# Patient Record
Sex: Male | Born: 1980 | Race: White | Hispanic: No | Marital: Single | Smoking: Current every day smoker
Health system: Southern US, Community
[De-identification: ages and names within clinical notes are randomized; demographics above are authoritative.]

## PROBLEM LIST (undated history)

## (undated) DIAGNOSIS — F329 Major depressive disorder, single episode, unspecified: Secondary | ICD-10-CM

## (undated) DIAGNOSIS — F32A Depression, unspecified: Secondary | ICD-10-CM

---

## 2017-04-26 ENCOUNTER — Emergency Department (HOSPITAL_COMMUNITY)
Admission: EM | Admit: 2017-04-26 | Discharge: 2017-04-27 | Disposition: A | Discharge status: Home / Self Care | Payer: Self-pay | Attending: Emergency Medicine | Admitting: Emergency Medicine

## 2017-04-26 ENCOUNTER — Encounter (HOSPITAL_COMMUNITY): Payer: Self-pay | Admitting: Emergency Medicine

## 2017-04-26 DIAGNOSIS — R229 Localized swelling, mass and lump, unspecified: Secondary | ICD-10-CM

## 2017-04-26 DIAGNOSIS — F1110 Opioid abuse, uncomplicated: Secondary | ICD-10-CM | POA: Insufficient documentation

## 2017-04-26 DIAGNOSIS — F1910 Other psychoactive substance abuse, uncomplicated: Secondary | ICD-10-CM

## 2017-04-26 DIAGNOSIS — F17210 Nicotine dependence, cigarettes, uncomplicated: Secondary | ICD-10-CM | POA: Insufficient documentation

## 2017-04-26 HISTORY — DX: Major depressive disorder, single episode, unspecified: F32.9

## 2017-04-26 HISTORY — DX: Depression, unspecified: F32.A

## 2017-04-26 LAB — BASIC METABOLIC PANEL
Anion gap: 9 (ref 5–15)
BUN: 12 mg/dL (ref 6–20)
CHLORIDE: 108 mmol/L (ref 101–111)
CO2: 23 mmol/L (ref 22–32)
Calcium: 9 mg/dL (ref 8.9–10.3)
Creatinine, Ser: 1.09 mg/dL (ref 0.61–1.24)
GFR calc Af Amer: >60 mL/min (ref >60)
GFR calc non Af Amer: >60 mL/min (ref >60)
Glucose, Bld: 119 mg/dL — ABNORMAL HIGH (ref 65–99)
Potassium: 4.2 mmol/L (ref 3.5–5.1)
SODIUM: 140 mmol/L (ref 135–145)

## 2017-04-26 LAB — CBC
HCT: 47.3 % (ref 39.0–52.0)
HEMOGLOBIN: 16.1 g/dL (ref 13.0–17.0)
MCH: 30.6 pg (ref 26.0–34.0)
MCHC: 34 g/dL (ref 30.0–36.0)
MCV: 89.9 fL (ref 78.0–100.0)
PLATELETS: 362 10*3/uL (ref 150–400)
RBC: 5.26 MIL/uL (ref 4.22–5.81)
RDW: 14.3 % (ref 11.5–15.5)
WBC: 12.1 10*3/uL — ABNORMAL HIGH (ref 4.0–10.5)

## 2017-04-26 LAB — I-STAT CG4 LACTIC ACID, ED: LACTIC ACID, VENOUS: 2.7 mmol/L — AB (ref 0.5–1.9)

## 2017-04-26 MED ORDER — VANCOMYCIN HCL 10 G IV SOLR
1500.0000 mg | Freq: Once | INTRAVENOUS | Status: AC
  Administered 2017-04-27: 1500 mg via INTRAVENOUS
  Filled 2017-04-26: qty 1500

## 2017-04-26 MED ORDER — SODIUM CHLORIDE 0.9 % IV BOLUS (SEPSIS)
2000.0000 mL | Freq: Once | INTRAVENOUS | Status: AC
  Administered 2017-04-27: 2000 mL via INTRAVENOUS

## 2017-04-26 NOTE — ED Notes (Signed)
Pt reports last heroin usage was Tues 5/22. Pt states he has been very lethargic and sleepy lately. Pt's family states he will be awake one minute and be asleep the next. Pt has bumps the size of a nickel up and down forearms from where he would use.

## 2017-04-26 NOTE — ED Provider Notes (Signed)
[facility]-EMERGENCY DEPT Provider Note   CSN: [id number] Arrival date & time: 04/26/17  2011   History   Chief Complaint Chief Complaint  Patient presents with  . multiple sore areas to arms from heroine use per pt    HPI Marcus Boone is a 36 y.o. male who presents with multiple nodules on bilateral forearms. Past medical history significant for IV drug abuse (heroin). He states the last time he used was 6 days ago. His wife noticed several skin nodules on his forearms a couple days ago and believes they are getting bigger. He has 4 nodules on his right forearm and one on his left. He denies that they are causing a lot of pain and denies drainage from the area. He lives in [place], [place] and came to [place] to be with his wife who lives here and detox. He is working on getting into a detox center in [place] but needs to get the financial aspect of that arranged. He has been going through withdrawals and states that the main symptom he is having now is "crippling anxiety". He denies any known fevers, chest pain, shortness of breath. Wife reports that he has had significant fatigue.  HPI  Past Medical History:  Diagnosis Date  . Depression     There are no active problems to display for this patient.   History reviewed. No pertinent surgical history.     Home Medications    Prior to Admission medications   Not on File    Family History No family history on file.  Social History Social History  Substance Use Topics  . Smoking status: Current Every Day Smoker    Packs/day: 1.00    Types: Cigarettes  . Smokeless tobacco: Never Used  . Alcohol use Yes     Comment: occasionally     Allergies   Tramadol   Review of Systems Review of Systems  Constitutional: Positive for diaphoresis and fatigue. Negative for chills and fever.  Respiratory: Negative for shortness of breath.   Cardiovascular: Negative for chest pain.  Gastrointestinal: Negative for abdominal  pain, nausea and vomiting.  Musculoskeletal: Negative for back pain.  Skin:       +nodules  Psychiatric/Behavioral: The patient is nervous/anxious.   All other systems reviewed and are negative.    Physical Exam Updated Vital Signs BP 111/75   Pulse 96   Temp 98.7 F (37.1 C) (Oral)   Resp 18   Ht 5\' 11"  (1.803 m)   Wt 78 kg (172 lb)   SpO2 100%   BMI 23.99 kg/m   Physical Exam  Constitutional: He is oriented to person, place, and time. He appears well-developed and well-nourished. No distress.  HENT:  Head: Normocephalic and atraumatic.  Eyes: Conjunctivae are normal. Pupils are equal, round, and reactive to light. Right eye exhibits no discharge. Left eye exhibits no discharge. No scleral icterus.  Neck: Normal range of motion.  Cardiovascular: Normal rate and regular rhythm.  Exam reveals no gallop and no friction rub.   No murmur heard. Pulmonary/Chest: Effort normal and breath sounds normal. No respiratory distress. He has no wheezes. He has no rales. He exhibits no tenderness.  Abdominal: He exhibits no distension.  Neurological: He is alert and oriented to person, place, and time.  Skin: Skin is warm and dry.  4 quarter sized nodules on right forearm which are mildly tender to palpation. No redness or warmth. 1 quarter sized nodule on left forearm. No fluctuance or drainage  Psychiatric:  His behavior is normal. His mood appears anxious.  Nursing note and vitals reviewed.    ED Treatments / Results  Labs (all labs ordered are listed, but only abnormal results are displayed) Labs Reviewed  CBC - Abnormal; Notable for the following:       Result Value   WBC 12.1 (*)    All other components within normal limits  BASIC METABOLIC PANEL - Abnormal; Notable for the following:    Glucose, Bld 119 (*)    All other components within normal limits  I-STAT CG4 LACTIC ACID, ED - Abnormal; Notable for the following:    Lactic Acid, Venous 2.70 (*)    All other components  within normal limits  CULTURE, BLOOD (ROUTINE X 2)  CULTURE, BLOOD (ROUTINE X 2)  HIV ANTIBODY (ROUTINE TESTING)  HEPATITIS C ANTIBODY  I-STAT CG4 LACTIC ACID, ED    EKG  EKG Interpretation None       Radiology No results found.  Procedures Procedures (including critical care time)  Medications Ordered in ED Medications  sodium chloride 0.9 % bolus 2,000 mL (not administered)  vancomycin (VANCOCIN) 1,500 mg in sodium chloride 0.9 % 500 mL IVPB (not administered)     Initial Impression / Assessment and Plan / ED Course  I have reviewed the triage vital signs and the nursing notes.  Pertinent labs & imaging results that were available during my care of the patient were reviewed by me and considered in my medical decision making (see chart for details).  36 year old male with multiple skin nodules. US was used to evaluate nodules. They do not appear consistent with abscesses. Vital signs are normal. CBC remarkable for leukocytosis of 12.1. BMP unremarkable. Lactic acid is 2.7. Blood cultures were drawn. HIV and Hep C were obtained per pt and family request. Shared visit with Dr. Ranae PalmsYelverton. Will start fluids and antibiotics.   Patient signed out to K Humes PA-C at shift change. If repeat Lactic acid is improved, anticipate d/c with resources.  Final Clinical Impressions(s) / ED Diagnoses   Final diagnoses:  IV drug abuse    New Prescriptions New Prescriptions   No medications on file     Beryle QuantGekas, Joseth Weigel Marie, PA-C 04/27/17 0050    Loren RacerYelverton, David, MD 05/06/17 1324

## 2017-04-26 NOTE — ED Triage Notes (Signed)
Reports coming here last week for detox from heroine.  Now c/o wounds to bil arms from using.

## 2017-04-27 LAB — I-STAT CG4 LACTIC ACID, ED
LACTIC ACID, VENOUS: 0.86 mmol/L (ref 0.5–1.9)
Lactic Acid, Venous: 2.06 mmol/L (ref 0.5–1.9)

## 2017-04-27 LAB — HIV ANTIBODY (ROUTINE TESTING W REFLEX): HIV Screen 4th Generation wRfx: NONREACTIVE

## 2017-04-27 MED ORDER — CLONIDINE HCL 0.1 MG PO TABS: 0.1000 mg | ORAL_TABLET | Freq: Two times a day (BID) | ORAL | 1 refills | Status: AC

## 2017-04-27 MED ORDER — GI COCKTAIL ~~LOC~~
30.0000 mL | Freq: Once | ORAL | Status: AC
  Administered 2017-04-27: 30 mL via ORAL
  Filled 2017-04-27: qty 30

## 2017-04-27 MED ORDER — CEPHALEXIN 500 MG PO CAPS
500.0000 mg | ORAL_CAPSULE | Freq: Four times a day (QID) | ORAL | 0 refills | Status: AC
Start: 2017-04-27 — End: ?

## 2017-04-27 MED ORDER — ONDANSETRON 4 MG PO TBDP: 4.0000 mg | ORAL_TABLET | Freq: Three times a day (TID) | ORAL | 0 refills | Status: AC | PRN

## 2017-04-27 MED ORDER — RANITIDINE HCL 150 MG/10ML PO SYRP
150.0000 mg | ORAL_SOLUTION | Freq: Once | ORAL | Status: AC
  Administered 2017-04-27: 150 mg via ORAL
  Filled 2017-04-27: qty 10

## 2017-04-27 NOTE — ED Notes (Signed)
Second I-stat lactic acid was drawn before any fluids or medications was given.

## 2017-04-27 NOTE — Discharge Instructions (Signed)
Take Keflex to prevent infection. You may use Zofran for nausea and Catapres for anxiety or palpitations associated with withdrawal. Take Pepcid OTC for reflux symptoms. Continue with outpatient drug rehabilitation.

## 2017-04-27 NOTE — ED Notes (Signed)
Attempted IV x 2 with no success. 

## 2017-04-27 NOTE — ED Provider Notes (Signed)
5:20 AM Patient care assumed from [facility] D[facility], PA-C at change of shift. Patient with history of heroin abuse. He is presenting for sores to his forearm which are more consistent with nodules. No signs of acute infection, the patient has been covered with antibiotics. He was noted to have a leukocytosis and elevated lactic acid level. I suspect this to be due to acute withdrawal. Lactic acid level has normalized following IV fluids.  Patient reassessed. He is complaining of reflux like symptoms. Zantac ordered to be given. Vitals stable and patient is nontoxic appearing. I do not believe further emergent workup or inpatient management is indicated. The patient has been instructed to continue with outpatient rehabilitation as planned. Supportive medications prescribed and return precautions given. Patient discharged in stable condition with no unaddressed concerns.   Marcus Boone, Marcus Chervenak, PA-C 04/27/17 [id number]    Dione BoozeGlick, David, MD 04/27/17 [phone]

## 2017-04-28 LAB — HEPATITIS C ANTIBODY: HCV Ab: >11 s/co ratio — ABNORMAL HIGH (ref 0.0–0.9)

## 2017-05-02 LAB — CULTURE, BLOOD (ROUTINE X 2)
CULTURE: NO GROWTH
Culture: NO GROWTH
SPECIAL REQUESTS: ADEQUATE
SPECIAL REQUESTS: ADEQUATE

## 2019-12-09 ENCOUNTER — Emergency Department (HOSPITAL_COMMUNITY)
Admission: EM | Admit: 2019-12-09 | Discharge: 2019-12-09 | Disposition: A | Discharge status: Home / Self Care | Payer: Self-pay | Attending: Emergency Medicine | Admitting: Emergency Medicine

## 2019-12-09 ENCOUNTER — Other Ambulatory Visit: Payer: Self-pay

## 2019-12-09 ENCOUNTER — Encounter (HOSPITAL_COMMUNITY): Payer: Self-pay | Admitting: Emergency Medicine

## 2019-12-09 ENCOUNTER — Emergency Department (HOSPITAL_COMMUNITY): Payer: Self-pay

## 2019-12-09 DIAGNOSIS — F17210 Nicotine dependence, cigarettes, uncomplicated: Secondary | ICD-10-CM | POA: Insufficient documentation

## 2019-12-09 DIAGNOSIS — S62001A Unspecified fracture of navicular [scaphoid] bone of right wrist, initial encounter for closed fracture: Secondary | ICD-10-CM | POA: Insufficient documentation

## 2019-12-09 DIAGNOSIS — Y939 Activity, unspecified: Secondary | ICD-10-CM | POA: Insufficient documentation

## 2019-12-09 DIAGNOSIS — Y999 Unspecified external cause status: Secondary | ICD-10-CM | POA: Insufficient documentation

## 2019-12-09 DIAGNOSIS — S62346A Nondisplaced fracture of base of fifth metacarpal bone, right hand, initial encounter for closed fracture: Secondary | ICD-10-CM | POA: Insufficient documentation

## 2019-12-09 DIAGNOSIS — S62001K Unspecified fracture of navicular [scaphoid] bone of right wrist, subsequent encounter for fracture with nonunion: Secondary | ICD-10-CM

## 2019-12-09 DIAGNOSIS — Y929 Unspecified place or not applicable: Secondary | ICD-10-CM | POA: Insufficient documentation

## 2019-12-09 DIAGNOSIS — Y33XXXA Other specified events, undetermined intent, initial encounter: Secondary | ICD-10-CM | POA: Insufficient documentation

## 2019-12-09 DIAGNOSIS — Z79899 Other long term (current) drug therapy: Secondary | ICD-10-CM | POA: Insufficient documentation

## 2019-12-09 MED ORDER — IBUPROFEN 800 MG PO TABS: 800.0000 mg | ORAL_TABLET | Freq: Three times a day (TID) | ORAL | 0 refills | Status: AC

## 2019-12-09 NOTE — ED Triage Notes (Signed)
Pt reports R wrist/hand pain x 2 months. States he has been wearing a velcro wrist splint.  This morning he pushed down on ground to stand up and c/o pain to R hand and wrist.

## 2019-12-09 NOTE — ED Notes (Signed)
[facility]ro Pharmacist, hospital.

## 2019-12-09 NOTE — Discharge Instructions (Addendum)
Please call Dr. 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  Please take the ibuprofen 800 mg 3 times daily as needed for your pain and discomfort. Please discontinue if you notice any dark black stools.  Return to the ED or seek medical attention for any new or worsening symptoms.

## 2019-12-09 NOTE — ED Notes (Signed)
Ortho paged for splint 

## 2019-12-09 NOTE — ED Notes (Signed)
Pt dc'd home w/all belongings, a/o x4, ambulatory on dc  

## 2019-12-09 NOTE — Progress Notes (Signed)
Orthopedic Tech Progress Note Patient Details:  Marcus Boone 03-Mar-1981 [id number]  Ortho Devices Type of Ortho Device: Ace wrap, Ulna gutter splint Ortho Device/Splint Interventions: Application   Post Interventions Patient Tolerated: Well Instructions Provided: Care of device   Saul Fordyce 12/09/2019, 4:25 PM

## 2019-12-09 NOTE — ED Provider Notes (Signed)
[facility] EMERGENCY DEPARTMENT Provider Note   CSN: [id number] Arrival date & time: 12/09/19  1342     History Chief Complaint  Patient presents with  . Hand Pain    Marcus Boone is a 39 y.o. male with no relevant past medical history presents to the ED after sustaining hand injury.  Patient apparently injured his wrist 6 months ago during a skateboarding accident and has since been wearing a splint that he had obtained at [facility].  He has been making a concerted effort not to use his right hand, but when he went to assist himself to his feet after sitting on the ground drinking coffee, he lost his balance and caught himself using his right hand that had been balled up in a fist.  Patient immediately felt pain on the right side of his hand and noticed swelling which prompted him to come to the ED for evaluation.  He adamantly denies any fighting and instead states that he has never thrown a punch in his life.  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  He denies any other injury, numbness or tingling, or other symptoms.  He took 600 mg ibuprofen with good effect.   HPI     Past Medical History:  Diagnosis Date  . Depression     There are no problems to display for this patient.   History reviewed. No pertinent surgical history.     No family history on file.  Social History   Tobacco Use  . Smoking status: Current Every Day Smoker    Packs/day: 1.00    Types: Cigarettes  . Smokeless tobacco: Never Used  Substance Use Topics  . Alcohol use: Yes    Comment: occasionally  . Drug use: Yes    Types: IV    Home Medications Prior to Admission medications   Medication Sig Start Date End Date Taking? Authorizing Provider  cephALEXin (KEFLEX) 500 MG capsule Take 1 capsule (500 mg total) by mouth 4 (four) times daily. 04/27/17   Antonietta Breach, PA-C  cloNIDine (CATAPRES) 0.1 MG tablet Take 1 tablet (0.1 mg total) by mouth 2 (two) times  daily. For palpitations or anxiety associated with withdrawal 04/27/17   Antonietta Breach, PA-C  ibuprofen (ADVIL) 800 MG tablet Take 1 tablet (800 mg total) by mouth 3 (three) times daily. 12/09/19   Corena Herter, PA-C  Multiple Vitamin (MULTIVITAMIN WITH MINERALS) TABS tablet Take 1 tablet by mouth once a week.    [provider]  ondansetron (ZOFRAN ODT) 4 MG disintegrating tablet Take 1 tablet (4 mg total) by mouth every 8 (eight) hours as needed for nausea or vomiting. 04/27/17   Antonietta Breach, PA-C    Allergies    Tramadol  Review of Systems   Review of Systems  Constitutional: Negative for fever.  Musculoskeletal: Positive for arthralgias.  Skin: Positive for color change.    Physical Exam Updated Vital Signs BP (!) 116/100   Pulse 78   Temp 98.8 F (37.1 C) (Oral)   Resp 18   SpO2 100%   Physical Exam Vitals and nursing note reviewed. Exam conducted with a chaperone present.  Constitutional:      Appearance: Normal appearance.  HENT:     Head: Normocephalic and atraumatic.  Eyes:     General: No scleral icterus.    Conjunctiva/sclera: Conjunctivae normal.  Cardiovascular:     Rate and Rhythm: Normal rate and regular rhythm.  Pulmonary:  Effort: Pulmonary effort is normal.     Breath sounds: Normal breath sounds.  Musculoskeletal:     Comments: Right hand: Swelling and mild ecchymosis noted over the 5th metacarpal.  Cap refill intact.  Assessed radial, ulnar, and median nerves and sensation intact throughout.  Patient can flex and extend against resistance.  Wrist flexion and extension limited.    Skin:    General: Skin is dry.  Neurological:     Mental Status: He is alert and oriented to person, place, and time.     GCS: GCS eye subscore is 4. GCS verbal subscore is 5. GCS motor subscore is 6.  Psychiatric:        Mood and Affect: Mood normal.        Behavior: Behavior normal.        Thought Content: Thought content normal.     ED Results /  Procedures / Treatments   Labs (all labs ordered are listed, but only abnormal results are displayed) Labs Reviewed - No data to display  EKG None  Radiology DG Wrist Complete Right  Result Date: 12/09/2019 CLINICAL DATA:  Wrist and hand pain for 2 months EXAM: RIGHT WRIST - COMPLETE 3+ VIEW COMPARISON:  None. FINDINGS: Lucency through the scaphoid waist with irregular margins and sclerosis compatible with a nonunion chronic scaphoid fracture. Mild increased sclerosis of the proximal scaphoid pole may represent early AVN changes. Widening of the scapholunate interval can be associated with ligamentous injury. Distal radius and ulna intact. Mild soft tissue swelling. a Additionally, there is an acute nondisplaced fracture of the right fifth metacarpal base. IMPRESSION: Acute nondisplaced fracture right fifth metacarpal base. 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 See above comment. Electronically Signed   By: Judie Petit.  Shick M.D.   On: 12/09/2019 14:36   DG Hand Complete Right  Result Date: 12/09/2019 CLINICAL DATA:  Fall, pain EXAM: RIGHT HAND - COMPLETE 3+ VIEW COMPARISON:  12/09/2019 FINDINGS: There is a subtle acute nondisplaced fracture of the right fifth metacarpal base close to the wrist. Mild soft tissue swelling. Metacarpals appear intact. No other acute osseous finding of the hand. Chronic nonunion scaphoid waist fracture. IMPRESSION: Acute nondisplaced fracture right fifth metacarpal base Chronic nonunion scaphoid waist fracture. Electronically Signed   By: Judie Petit.  Shick M.D.   On: 12/09/2019 14:38    Procedures Procedures (including critical care time)  Medications Ordered in ED Medications - No data to display  ED Course  I have reviewed the triage vital signs and the nursing notes.  Pertinent labs & imaging results that were available during my care of the patient were reviewed by me and considered in my medical decision making (see  chart for details).    MDM Rules/Calculators/A&P                      DG right wrist demonstrate acute nondisplaced fracture of the base of the fifth metacarpal in addition to a chronic nonunion scaphoid fracture.  Will refer patient to Dr. Melvyn Novas, hand specialist, for ongoing evaluation and management.  Given patient's nonunion scaphoid fracture, he may benefit from osteogenesis stimulator or surgical correction.  Patient's vital signs are within normal limits and he is neurovascularly intact.  We will place patient in an ulnar gutter splint and prescribed ibuprofen 800 mg 3 times daily as needed for his pain discomfort.  Encouraged patient to ice the affected area today and to call the office of  Dr. Melvyn Novas soon as possible to schedule an appointment for further evaluation.  Also will provide patient a referral to [facility] health committee health and wellness so that he may get established with a primary care provider.  Patient is from [facility] and has not been evaluated by a primary care provider since his move to [place].  Return precautions discussed. All of the evaluation and work-up results were discussed with the patient and any family at bedside. They were provided opportunity to ask any additional questions and have none at this time. They have expressed understanding of verbal discharge instructions as well as return precautions and are agreeable to the plan.    Final Clinical Impression(s) / ED Diagnoses Final diagnoses:  Closed nondisplaced fracture of base of fifth metacarpal bone of right hand, initial encounter  Closed nondisplaced fracture of scaphoid of right wrist with nonunion, unspecified portion of scaphoid, subsequent encounter    Rx / DC Orders ED Discharge Orders         Ordered    ibuprofen (ADVIL) 800 MG tablet  3 times daily     12/09/19 1606           Lorelee New, PA-C 12/09/19 1606    Pricilla Loveless, MD 12/10/19 [phone]

## 2021-07-16 IMAGING — DX DG HAND COMPLETE 3+V*R*
3 series · 3 of 3 positions shown · non-contrast
Comparison: 12/09/2019

CLINICAL DATA: Fall, pain

EXAM:
RIGHT HAND - COMPLETE 3+ VIEW

[hand pa]
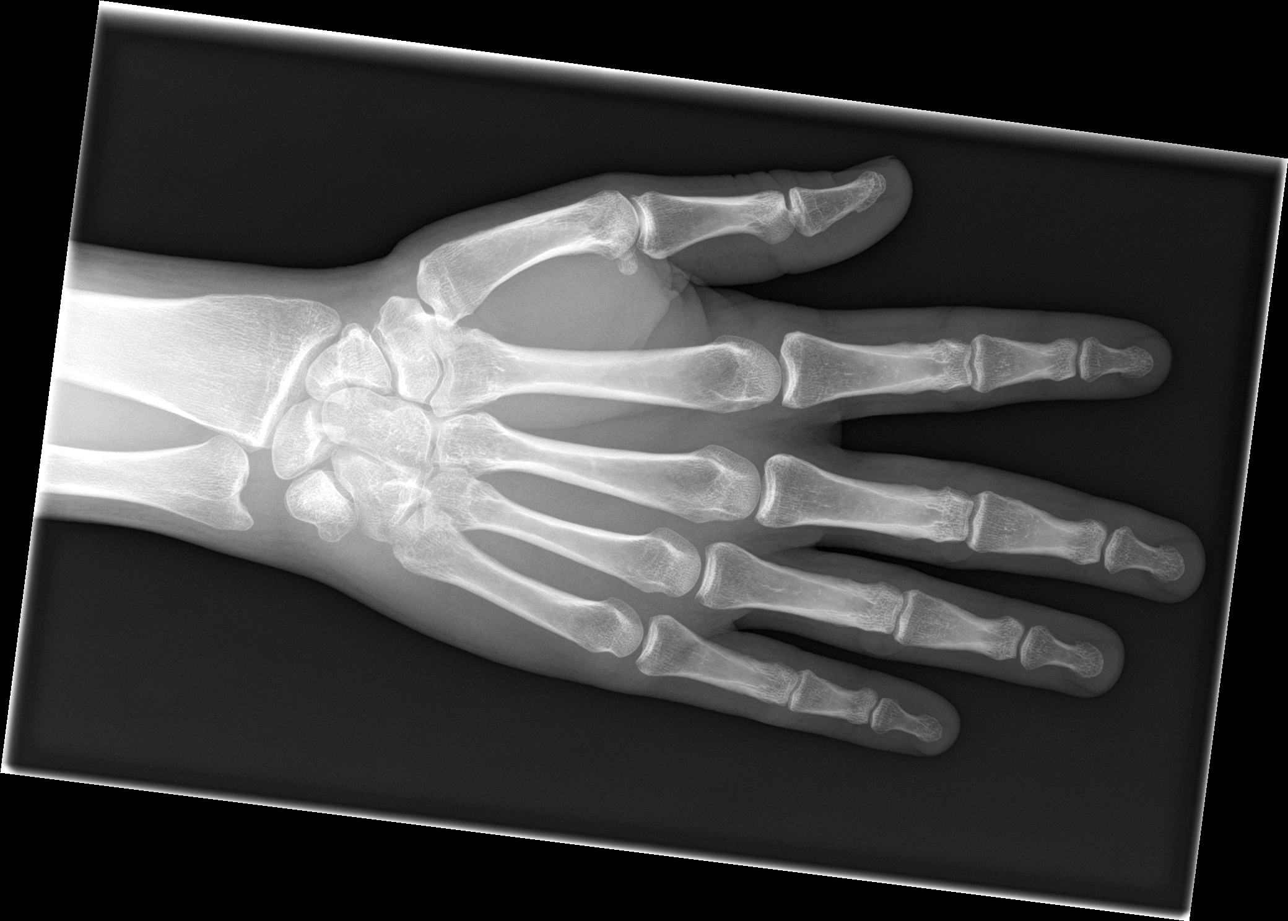

[hand obl]
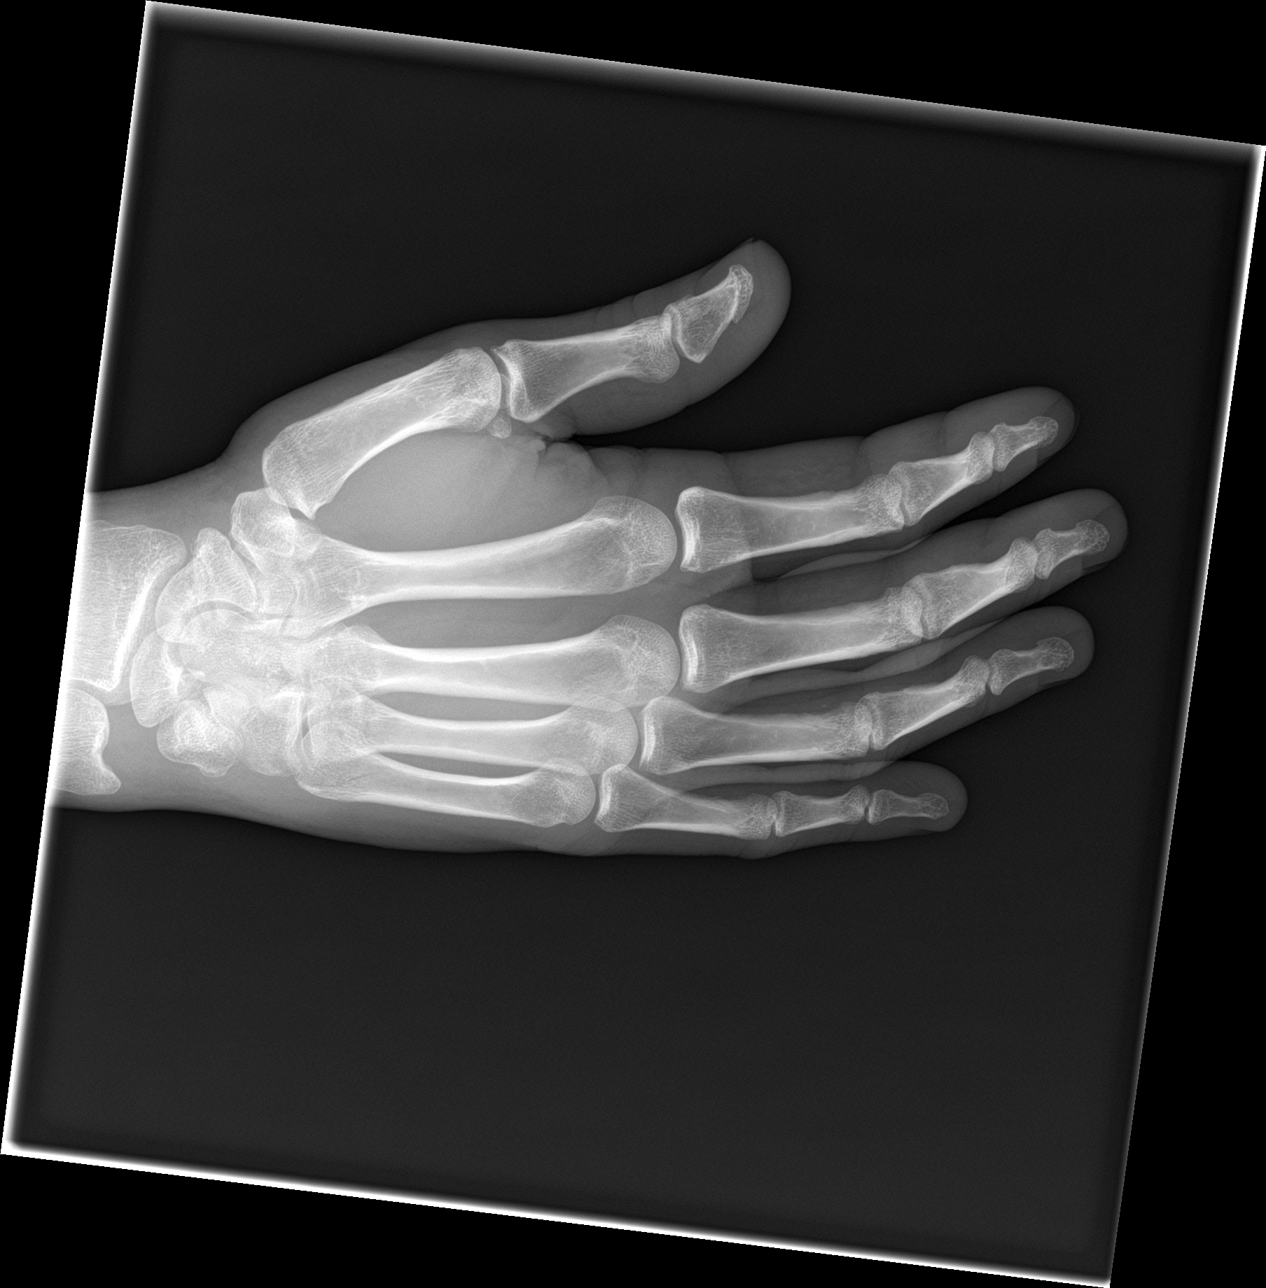

[hand lat]
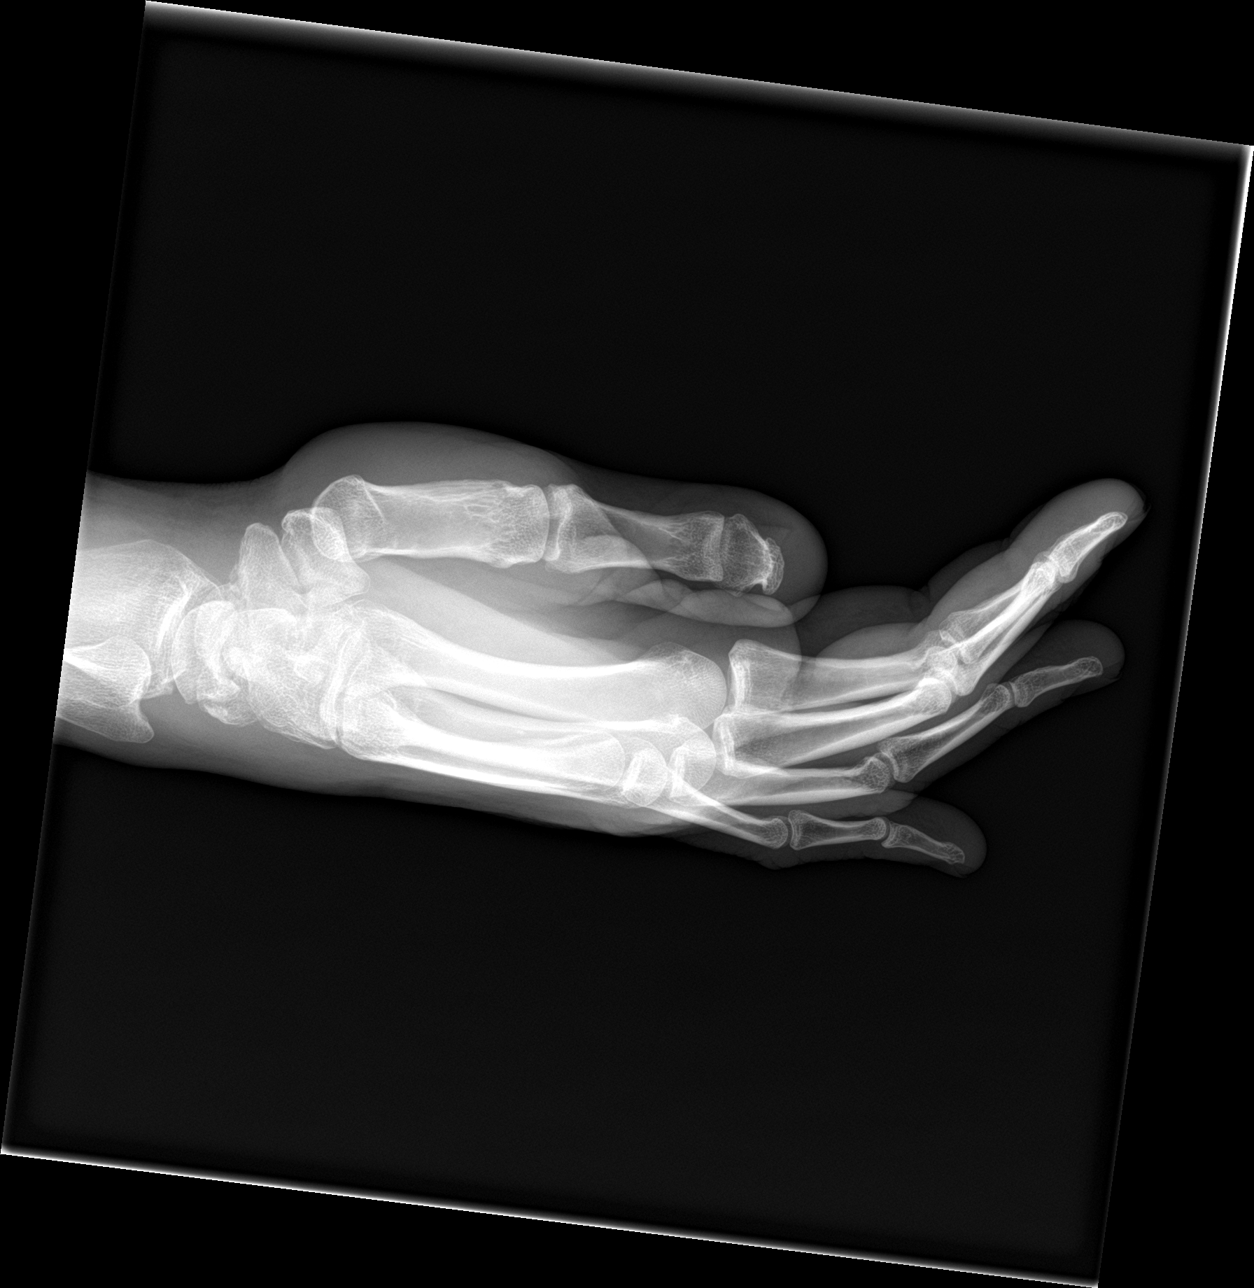

[3 of 3 positions shown; findings below may reference images not displayed]

FINDINGS: There is a subtle acute nondisplaced fracture of the right fifth
metacarpal base close to the wrist. Mild soft tissue swelling.
Metacarpals appear intact.

No other acute osseous finding of the hand.

Chronic nonunion scaphoid waist fracture.
IMPRESSION: Acute nondisplaced fracture right fifth metacarpal base

Chronic nonunion scaphoid waist fracture.
